# Patient Record
Sex: Female | Born: 1945 | Race: White | Hispanic: No | Smoking: Never smoker
Health system: Southern US, Community
[De-identification: ages and names within clinical notes are randomized; demographics above are authoritative.]

## PROBLEM LIST (undated history)

## (undated) DIAGNOSIS — G43909 Migraine, unspecified, not intractable, without status migrainosus: Secondary | ICD-10-CM

## (undated) DIAGNOSIS — F419 Anxiety disorder, unspecified: Secondary | ICD-10-CM

## (undated) DIAGNOSIS — I10 Essential (primary) hypertension: Secondary | ICD-10-CM

## (undated) HISTORY — PX: GASTRIC BYPASS: SHX52

## (undated) HISTORY — DX: Anxiety disorder, unspecified: F41.9

## (undated) HISTORY — DX: Migraine, unspecified, not intractable, without status migrainosus: G43.909

## (undated) HISTORY — PX: CHOLECYSTECTOMY: SHX55

## (undated) HISTORY — DX: Essential (primary) hypertension: I10

---

## 2011-04-09 DIAGNOSIS — I10 Essential (primary) hypertension: Secondary | ICD-10-CM | POA: Insufficient documentation

## 2011-04-09 DIAGNOSIS — G43009 Migraine without aura, not intractable, without status migrainosus: Secondary | ICD-10-CM | POA: Insufficient documentation

## 2011-04-09 DIAGNOSIS — E782 Mixed hyperlipidemia: Secondary | ICD-10-CM | POA: Insufficient documentation

## 2011-04-09 DIAGNOSIS — F3342 Major depressive disorder, recurrent, in full remission: Secondary | ICD-10-CM | POA: Insufficient documentation

## 2011-04-09 DIAGNOSIS — G4733 Obstructive sleep apnea (adult) (pediatric): Secondary | ICD-10-CM | POA: Insufficient documentation

## 2011-04-09 DIAGNOSIS — K912 Postsurgical malabsorption, not elsewhere classified: Secondary | ICD-10-CM | POA: Insufficient documentation

## 2014-05-12 DIAGNOSIS — Z961 Presence of intraocular lens: Secondary | ICD-10-CM | POA: Insufficient documentation

## 2014-05-26 DIAGNOSIS — Z961 Presence of intraocular lens: Secondary | ICD-10-CM | POA: Insufficient documentation

## 2015-05-21 DIAGNOSIS — M8580 Other specified disorders of bone density and structure, unspecified site: Secondary | ICD-10-CM | POA: Insufficient documentation

## 2015-07-20 DIAGNOSIS — M1711 Unilateral primary osteoarthritis, right knee: Secondary | ICD-10-CM | POA: Insufficient documentation

## 2015-12-03 DIAGNOSIS — M1712 Unilateral primary osteoarthritis, left knee: Secondary | ICD-10-CM | POA: Insufficient documentation

## 2016-01-31 DIAGNOSIS — E538 Deficiency of other specified B group vitamins: Secondary | ICD-10-CM | POA: Insufficient documentation

## 2016-05-18 DIAGNOSIS — K289 Gastrojejunal ulcer, unspecified as acute or chronic, without hemorrhage or perforation: Secondary | ICD-10-CM | POA: Insufficient documentation

## 2017-01-03 DIAGNOSIS — M545 Low back pain, unspecified: Secondary | ICD-10-CM | POA: Insufficient documentation

## 2017-01-03 DIAGNOSIS — M533 Sacrococcygeal disorders, not elsewhere classified: Secondary | ICD-10-CM | POA: Insufficient documentation

## 2017-01-16 DIAGNOSIS — H903 Sensorineural hearing loss, bilateral: Secondary | ICD-10-CM | POA: Insufficient documentation

## 2017-02-26 DIAGNOSIS — Z9884 Bariatric surgery status: Secondary | ICD-10-CM | POA: Insufficient documentation

## 2017-06-05 DIAGNOSIS — Z86010 Personal history of colonic polyps: Secondary | ICD-10-CM | POA: Insufficient documentation

## 2018-02-27 DIAGNOSIS — M5416 Radiculopathy, lumbar region: Secondary | ICD-10-CM | POA: Insufficient documentation

## 2018-02-27 DIAGNOSIS — J4520 Mild intermittent asthma, uncomplicated: Secondary | ICD-10-CM | POA: Insufficient documentation

## 2018-02-27 DIAGNOSIS — E1142 Type 2 diabetes mellitus with diabetic polyneuropathy: Secondary | ICD-10-CM | POA: Insufficient documentation

## 2018-04-29 DIAGNOSIS — M48061 Spinal stenosis, lumbar region without neurogenic claudication: Secondary | ICD-10-CM | POA: Insufficient documentation

## 2018-12-03 DIAGNOSIS — M546 Pain in thoracic spine: Secondary | ICD-10-CM | POA: Insufficient documentation

## 2020-07-28 DIAGNOSIS — M542 Cervicalgia: Secondary | ICD-10-CM | POA: Insufficient documentation

## 2020-07-28 DIAGNOSIS — M546 Pain in thoracic spine: Secondary | ICD-10-CM | POA: Insufficient documentation

## 2020-08-24 DIAGNOSIS — G894 Chronic pain syndrome: Secondary | ICD-10-CM | POA: Insufficient documentation

## 2020-08-24 DIAGNOSIS — Z9181 History of falling: Secondary | ICD-10-CM | POA: Insufficient documentation

## 2020-12-10 DIAGNOSIS — R3129 Other microscopic hematuria: Secondary | ICD-10-CM | POA: Insufficient documentation

## 2021-12-07 ENCOUNTER — Emergency Department: Payer: Medicare Other

## 2021-12-07 ENCOUNTER — Other Ambulatory Visit: Payer: Self-pay

## 2021-12-07 ENCOUNTER — Emergency Department
Admission: EM | Admit: 2021-12-07 | Discharge: 2021-12-08 | Disposition: A | Discharge status: Home / Self Care | Payer: Medicare Other | Attending: Emergency Medicine | Admitting: Emergency Medicine

## 2021-12-07 ENCOUNTER — Encounter: Payer: Self-pay | Admitting: *Deleted

## 2021-12-07 DIAGNOSIS — S2241XA Multiple fractures of ribs, right side, initial encounter for closed fracture: Secondary | ICD-10-CM | POA: Insufficient documentation

## 2021-12-07 DIAGNOSIS — W01198A Fall on same level from slipping, tripping and stumbling with subsequent striking against other object, initial encounter: Secondary | ICD-10-CM | POA: Insufficient documentation

## 2021-12-07 DIAGNOSIS — S299XXA Unspecified injury of thorax, initial encounter: Secondary | ICD-10-CM | POA: Diagnosis present

## 2021-12-07 DIAGNOSIS — S0083XA Contusion of other part of head, initial encounter: Secondary | ICD-10-CM | POA: Insufficient documentation

## 2021-12-07 DIAGNOSIS — W19XXXA Unspecified fall, initial encounter: Secondary | ICD-10-CM

## 2021-12-07 MED ORDER — MELOXICAM 7.5 MG PO TABS: 7.5000 mg | ORAL_TABLET | Freq: Every day | ORAL | 0 refills | Status: AC

## 2021-12-07 MED ORDER — OXYCODONE-ACETAMINOPHEN 5-325 MG PO TABS: 1.0000 | ORAL_TABLET | Freq: Four times a day (QID) | ORAL | 0 refills | Status: DC | PRN

## 2021-12-07 MED ORDER — OXYCODONE-ACETAMINOPHEN 5-325 MG PO TABS
1.0000 | ORAL_TABLET | Freq: Once | ORAL | Status: AC
  Administered 2021-12-08: 1 via ORAL
  Filled 2021-12-07: qty 1

## 2021-12-07 MED ORDER — OXYCODONE-ACETAMINOPHEN 5-325 MG PO TABS
1.0000 | ORAL_TABLET | Freq: Once | ORAL | Status: AC
  Administered 2021-12-07: 1 via ORAL
  Filled 2021-12-07: qty 1

## 2021-12-07 NOTE — ED Triage Notes (Addendum)
Pt fell 1 week ago.  Pt saw her doctor today and has rightrib fx.  Pt sent to er for eval.  Pt has bruising to chin area.   No chest pain.  No neck pain.  Pt has right back pain   no h/a  no loc.   Pt alert  speech clear.

## 2021-12-07 NOTE — ED Provider Notes (Signed)
[facility] Provider Note  Patient Contact: 11:29 PM (approximate)   History   Rib Injury   HPI  Margaret Walker is a 76 y.o. female who presents the emergency department complaining of rib pain and facial injury.  Patient sustained a mechanical fall approximately a week ago.  She states that she had bent over to pick up something, lost her balance and fell.  She did hit her face but did not lose consciousness.  She states that she has had significant bruising along the right side of her face, chin and into her neck.  She was also complaining of rib pain after this fall.  She saw her primary care, had rib x-rays and it was determined that she has 2 nondisplaced fractures to the right ribs.  Patient is in no shortness of breath, no substernal pain.  She denies any GI issues or abdominal pain.  Primary care was concerned given the bruising of her face and recommended that she follow-up in the ED for CT scans.  She denies any headache, visual changes, facial pain, neck pain at this time.     Physical Exam   Triage Vital Signs: ED Triage Vitals  Enc Vitals Group     BP 12/07/21 1952 (!) 168/64     Pulse Rate 12/07/21 1952 91     Resp 12/07/21 1952 20     Temp 12/07/21 1952 98.6 F (37 C)     Temp Source 12/07/21 1952 Oral     SpO2 12/07/21 1952 95 %     Weight 12/07/21 1958 215 lb (97.5 kg)     Height 12/07/21 1958 5' (1.524 m)     Head Circumference --      Peak Flow --      Pain Score 12/07/21 1952 9     Pain Loc --      Pain Edu? --      Excl. in GC? --     Most recent vital signs: Vitals:   12/07/21 1952 12/07/21 2318  BP: (!) 168/64 (!) 158/72  Pulse: 91 67  Resp: 20 20  Temp: 98.6 F (37 C)   SpO2: 95% 94%     General: Alert and in no acute distress. Eyes:  PERRL. EOMI. Head: Ecchymosis noted to the right mandibular line extending into the anterior neck.  Nontender to palpation along the mandible.  She has full range of motion.  No loose  dentition with evaluation of her mouth.  There is no lacerations of the mucosal surface of her mouth.  Nontender to palpation over the osseous structures of the head and face at this time.  No palpable abnormality or crepitus.  No battle signs, raccoon eyes, serosanguineous fluid drainage from the ears or nares. ENT:      Ears:       Nose: No congestion/rhinnorhea.      Mouth/Throat: Mucous membranes are moist.  No loose dentition and no intraoral laceration. Neck: No stridor. No cervical spine tenderness to palpation.  Cardiovascular:  Good peripheral perfusion Respiratory: Normal respiratory effort without tachypnea or retractions. Lungs CTAB. Good air entry to the bases with no decreased or absent breath sounds. Musculoskeletal: Full range of motion to all extremities.  Palpation along the right ribs reveals tenderness.  No palpable abnormality.  No crepitus.  No subcutaneous emphysema.  Good underlying breath sounds bilaterally. Neurologic:  No gross focal neurologic deficits are appreciated.  Skin:   No rash noted Other:   ED  Results / Procedures / Treatments   Labs (all labs ordered are listed, but only abnormal results are displayed) Labs Reviewed - No data to display   EKG     RADIOLOGY  I personally viewed and evaluated these images as part of my medical decision making, as well as reviewing the written report by the radiologist.  ED Provider Interpretation: Chest x-ray revealed no acute displaced rib fractures.  No pneumothorax.  CT scan of the head, face, neck reveals no acute traumatic findings to include facial fracture, skull fracture, intracranial hemorrhage or skull fractures of the cervical spine.  DG Chest 2 View  Result Date: 12/07/2021 CLINICAL DATA:  Larey SeatFell 1 week ago, right-sided rib pain EXAM: CHEST - 2 VIEW COMPARISON:  None. FINDINGS: Frontal and lateral views of the chest demonstrate an unremarkable cardiac silhouette. No acute airspace disease, effusion, or  pneumothorax. Mild bilateral interstitial prominence likely reflects scarring. No acute displaced fractures. Diffuse thoracic spondylosis. IMPRESSION: 1. No acute intrathoracic process. 2. No displaced fracture. Electronically Signed   By: Sharlet SalinaMichael  Brown M.D.   On: 12/07/2021 20:37   CT HEAD WO CONTRAST (5MM)  Result Date: 12/07/2021 CLINICAL DATA:  Larey SeatFell 1 week ago, facial bruising EXAM: CT HEAD WITHOUT CONTRAST TECHNIQUE: Contiguous axial images were obtained from the base of the skull through the vertex without intravenous contrast. RADIATION DOSE REDUCTION: This exam was performed according to the departmental dose-optimization program which includes automated exposure control, adjustment of the mA and/or kV according to patient size and/or use of iterative reconstruction technique. COMPARISON:  None. FINDINGS: Brain: No acute infarct or hemorrhage. Lateral ventricles and midline structures are unremarkable. No acute extra-axial fluid collections. No mass effect. Vascular: No hyperdense vessel or unexpected calcification. Skull: Normal. Negative for fracture or focal lesion. Sinuses/Orbits: Mild mucoperiosteal thickening throughout the ethmoid and maxillary sinuses. Other: None. IMPRESSION: 1. No acute intracranial process. Electronically Signed   By: Sharlet SalinaMichael  Brown M.D.   On: 12/07/2021 22:36   CT Cervical Spine Wo Contrast  Result Date: 12/07/2021 CLINICAL DATA:  Larey SeatFell 1 week ago, facial bruising EXAM: CT CERVICAL SPINE WITHOUT CONTRAST TECHNIQUE: Multidetector CT imaging of the cervical spine was performed without intravenous contrast. Multiplanar CT image reconstructions were also generated. RADIATION DOSE REDUCTION: This exam was performed according to the departmental dose-optimization program which includes automated exposure control, adjustment of the mA and/or kV according to patient size and/or use of iterative reconstruction technique. COMPARISON:  None. FINDINGS: Alignment: There is reversal of  cervical lordosis centered at C3-4, likely due to multilevel degenerative changes. Otherwise alignment is grossly anatomic. Skull base and vertebrae: No acute fracture. No primary bone lesion or focal pathologic process. Soft tissues and spinal canal: No prevertebral fluid or swelling. No visible canal hematoma. Disc levels: There is diffuse multilevel spondylosis and facet hypertrophy from C3-4 through C7-T1. Symmetrical neural foraminal encroachment at these levels. Upper chest: Airway is patent. Scattered ground-glass densities at the lung apices could reflect hypoventilatory change. Other: Reconstructed images demonstrate no additional findings. IMPRESSION: 1. No acute cervical spine fracture. 2. Extensive multilevel spondylosis and facet hypertrophy. 3. Scattered ground-glass opacities within the bilateral upper lobes, which could reflect hypoventilatory change. Electronically Signed   By: Sharlet SalinaMichael  Brown M.D.   On: 12/07/2021 22:39   CT Maxillofacial Wo Contrast  Result Date: 12/07/2021 CLINICAL DATA:  Larey SeatFell 1 week ago, facial bruising EXAM: CT MAXILLOFACIAL WITHOUT CONTRAST TECHNIQUE: Multidetector CT imaging of the maxillofacial structures was performed. Multiplanar CT image reconstructions were also generated. RADIATION  DOSE REDUCTION: This exam was performed according to the departmental dose-optimization program which includes automated exposure control, adjustment of the mA and/or kV according to patient size and/or use of iterative reconstruction technique. COMPARISON:  None. FINDINGS: Osseous: No fracture or mandibular dislocation. No destructive process. Orbits: Negative. No traumatic or inflammatory finding. Sinuses: Mild mucoperiosteal thickening throughout the ethmoid and maxillary sinuses. No gas fluid levels. Soft tissues: Minimal subcutaneous fat stranding overlying the midline mandible. Limited intracranial: No significant or unexpected finding. IMPRESSION: 1. No acute facial bone fracture.  Minimal soft tissue edema overlying the anterior aspect of the mandible. 2. Mild mucoperiosteal thickening throughout the ethmoid and maxillary sinuses. Electronically Signed   By: Sharlet Salina M.D.   On: 12/07/2021 22:41    PROCEDURES:  Critical Care performed: No  Procedures   MEDICATIONS ORDERED IN ED: Medications  oxyCODONE-acetaminophen (PERCOCET/ROXICET) 5-325 MG per tablet 1 tablet (1 tablet Oral Given 12/07/21 2211)     IMPRESSION / MDM / ASSESSMENT AND PLAN / ED COURSE  I reviewed the triage vital signs and the nursing notes.                              Differential diagnosis includes, but is not limited to, facial fracture, mandibular fracture, intracranial hemorrhage, cervical spine fracture   Patient's diagnosis is consistent with fall, facial contusion, rib fractures.  Patient presented to the emergency department after sustaining a fall a week ago.  She was seen by her primary care, had rib series x-rays which revealed 2 nondisplaced fractures.  Patient had a chest x-ray here which reveals no evidence of pneumothorax and fractures are not clearly identified.  However patient is symptomatic and already has a previous diagnosis of rib fractures.  Review of patient's medical records reveals x-ray from [facility] which reveals fractures of the fifth and sixth ribs.  Patient was sent for CT scans of the head, face, neck as she had significant bruising along the face and mandible region.  Imaging reveals no acute traumatic findings of the head, face, neck.  At this time patient will have symptom control for her rib fractures.  I will give her a low-dose anti-inflammatory and limited prescription of pain medication.  Patient does take tramadol from her primary care.  I will only prescribe #8 of Percocet for this acute pain in her ribs.  Follow-up precautions with her primary care as well as return precautions to the emergency department are discussed with the patient and her  daughter who is present at this time.. Patient is given ED precautions to return to the ED for any worsening or new symptoms.        FINAL CLINICAL IMPRESSION(S) / ED DIAGNOSES   Final diagnoses:  Fall, initial encounter  Closed fracture of multiple ribs of right side, initial encounter  Contusion of face, initial encounter     Rx / DC Orders   ED Discharge Orders          Ordered    meloxicam (MOBIC) 7.5 MG tablet  Daily        12/07/21 2337    oxyCODONE-acetaminophen (PERCOCET/ROXICET) 5-325 MG tablet  Every 6 hours PRN        12/07/21 2337             Note:  This document was prepared using Dragon voice recognition software and may include unintentional dictation errors.   Racheal Patches, PA-C 12/07/21 2337  Shaune Pollack, MD 12/11/21 1109

## 2021-12-08 DIAGNOSIS — S2241XA Multiple fractures of ribs, right side, initial encounter for closed fracture: Secondary | ICD-10-CM | POA: Diagnosis not present

## 2022-02-12 ENCOUNTER — Ambulatory Visit
Admission: EM | Admit: 2022-02-12 | Discharge: 2022-02-12 | Disposition: A | Discharge status: Home / Self Care | Payer: Medicare Other | Attending: Emergency Medicine | Admitting: Emergency Medicine

## 2022-02-12 ENCOUNTER — Encounter: Payer: Self-pay | Admitting: Emergency Medicine

## 2022-02-12 ENCOUNTER — Ambulatory Visit (INDEPENDENT_AMBULATORY_CARE_PROVIDER_SITE_OTHER): Payer: Medicare Other

## 2022-02-12 DIAGNOSIS — I10 Essential (primary) hypertension: Secondary | ICD-10-CM

## 2022-02-12 DIAGNOSIS — S62521A Displaced fracture of distal phalanx of right thumb, initial encounter for closed fracture: Secondary | ICD-10-CM

## 2022-02-12 NOTE — ED Triage Notes (Signed)
Pt here after a fall yesterday and having right thumb pain. Thumb is bruised from finger tip to palm of hand.  ?

## 2022-02-12 NOTE — Discharge Instructions (Addendum)
Follow-up with an orthopedic hand specialist for your broken thumb.   ? ?Your blood pressure is elevated today at 170/78; recheck 159/80.  Please have this rechecked by your primary care provider in 2-4 weeks.     ? ?

## 2022-02-12 NOTE — ED Provider Notes (Signed)
?[facility] ? ? ? ?CSN: [id number] ?Arrival date & time: 02/12/22  1235 ? ? ?  ? ?History   ?Chief Complaint ?Chief Complaint  ?Patient presents with  ? Fall  ? Thumb Injury  ? ? ?HPI ?Margaret Walker is a 76 y.o. female.  Patient presents with pain and bruising of her right thumb due to a fall yesterday.  She caught her thumb on a car door and fell.  No head injury or loss of consciousness.  She denies numbness, weakness, paresthesias, open wounds, or other symptoms.  Her medical history includes hypertension, chronic pain syndrome, gastric bypass, asthma, morbid obesity, depression.  She has not taken any of her medications today. ? ?The history is provided by the patient and medical records.  ? ?History reviewed. No pertinent past medical history. ? ?Patient Active Problem List  ? Diagnosis Date Noted  ? Microscopic hematuria 12/10/2020  ? Chronic pain syndrome 08/24/2020  ? Risk for falls 08/24/2020  ? Neck pain 07/28/2020  ? Acute midline thoracic back pain 07/28/2020  ? Thoracic spine pain 12/03/2018  ? 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 Mild intermittent asthma without complication 02/27/2018  ? Right lumbar radiculopathy 02/27/2018  ? Type 2 diabetes mellitus with diabetic polyneuropathy, without long-term current use of insulin (HCC) 02/27/2018  ? History of colon polyps 06/05/2017  ? 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 Sensorineural hearing loss (SNHL) of both ears 01/16/2017  ? Sacral back pain 01/03/2017  ? Low back pain radiating to both legs 01/03/2017  ? Marginal ulcer 05/18/2016  ? Vitamin B12 deficiency 01/31/2016  ? Primary osteoarthritis of left knee 12/03/2015  ? Obesity, morbid, BMI 40.0-49.9 (HCC) 09/22/2015  ? Primary osteoarthritis of right knee 07/20/2015  ? Osteopenia 05/21/2015  ? Pseudophakia of left eye 05/26/2014  ? Pseudophakia of right eye 05/12/2014  ? Essential hypertension 04/09/2011  ? Migraine without aura and without status  migrainosus, not intractable 04/09/2011  ? Mixed hyperlipidemia 04/09/2011  ? OSA on CPAP 04/09/2011  ? 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 Recurrent major depressive disorder, in full remission (HCC) 04/09/2011  ? ? ?History reviewed. No pertinent surgical history. ? ?OB History   ?No obstetric history on file. ?  ? ? ? ?Home Medications   ? ?Prior to Admission medications   ?Medication Sig Start Date End Date Taking? Authorizing Provider  ?metoprolol succinate (TOPROL-XL) 25 MG 24 hr tablet Take 1 tablet by mouth daily. 04/19/21  Yes [provider]  ?SUMAtriptan (IMITREX) 100 MG tablet Take by mouth. 05/19/21  Yes [provider]  ?amLODipine (NORVASC) 10 MG tablet Take 10 mg by mouth at bedtime. 01/11/22   [provider]  ?atorvastatin (LIPITOR) 20 MG tablet Take 20 mg by mouth daily. 01/11/22   [provider]  ?buPROPion (WELLBUTRIN XL) 300 MG 24 hr tablet Take 300 mg by mouth daily. 11/18/21   [provider]  ?fexofenadine (ALLEGRA) 180 MG tablet Take 180 mg by mouth daily. 12/09/21   [provider]  ?FLUoxetine (PROZAC) 20 MG capsule Take 60 mg by mouth every morning. 11/18/21   [provider]  ?meloxicam (MOBIC) 7.5 MG tablet Take 1 tablet (7.5 mg total) by mouth daily. 12/07/21 12/07/22  Cuthriell, Delorise RoyalsJonathan D, PA-C  ?oxyCODONE (OXY IR/ROXICODONE) 5 MG immediate release tablet Take 5 mg by mouth every 6 (six) hours as needed. 12/07/21   [provider]  ?oxyCODONE-acetaminophen (PERCOCET/ROXICET) 5-325 MG tablet Take 1 tablet by mouth  every 6 (six) hours as needed for severe pain. 12/07/21   Cuthriell, Delorise Royals, PA-C  ?terbinafine (LAMISIL) 250 MG tablet Take 250 mg by mouth daily. 12/07/21   [provider]  ? ? ?Family History ?History reviewed. No pertinent family history. ? ?Social History ?Social History  ? ?Tobacco Use  ? Smoking status: Never  ? Smokeless tobacco: Never  ?Substance Use Topics  ? Alcohol use: Not Currently   ? ? ? ?Allergies   ?Patient has no known allergies. ? ? ?Review of Systems ?Review of Systems  ?Constitutional:  Negative for chills and fever.  ?Musculoskeletal:  Positive for arthralgias and joint swelling.  ?Skin:  Positive for color change.  ?Neurological:  Negative for weakness and numbness.  ?All other systems reviewed and are negative. ? ? ?Physical Exam ?Triage Vital Signs ?ED Triage Vitals [02/12/22 1303]  ?Enc Vitals Group  ?   BP   ?   Pulse   ?   Resp   ?   Temp   ?   Temp src   ?   SpO2   ?   Weight   ?   Height   ?   Head Circumference   ?   Peak Flow   ?   Pain Score 4  ?   Pain Loc   ?   Pain Edu?   ?   Excl. in GC?   ? ?No data found. ? ?Updated Vital Signs ?BP (!) 159/80   Pulse 87   Temp 97.9 ?F (36.6 ?C)   Resp 18   SpO2 93%  ? ?Visual Acuity ?Right Eye Distance:   ?Left Eye Distance:   ?Bilateral Distance:   ? ?Right Eye Near:   ?Left Eye Near:    ?Bilateral Near:    ? ?Physical Exam ?Vitals and nursing note reviewed.  ?Constitutional:   ?   General: She is not in acute distress. ?   Appearance: She is well-developed. She is obese. She is not ill-appearing.  ?HENT:  ?   Mouth/Throat:  ?   Mouth: Mucous membranes are moist.  ?Cardiovascular:  ?   Rate and Rhythm: Normal rate and regular rhythm.  ?   Heart sounds: No murmur heard. ?Pulmonary:  ?   Effort: Pulmonary effort is normal. No respiratory distress.  ?   Breath sounds: Normal breath sounds.  ?Abdominal:  ?   Palpations: Abdomen is soft.  ?   Tenderness: There is no abdominal tenderness.  ?Musculoskeletal:     ?   General: Swelling and tenderness present.  ?   Cervical back: Neck supple.  ?   Comments: Edema and ecchymosis at tip of right thumb.  No open wounds.  ?Skin: ?   General: Skin is warm and dry.  ?   Capillary Refill: Capillary refill takes less than 2 seconds.  ?   Findings: Bruising present.  ?Neurological:  ?   Mental Status: She is alert.  ?   Sensory: No sensory deficit.  ?   Motor: No weakness.  ?Psychiatric:     ?    Mood and Affect: Mood normal.     ?   Behavior: Behavior normal.  ? ? ? ?UC Treatments / Results  ?Labs ?(all labs ordered are listed, but only abnormal results are displayed) ?Labs Reviewed - No data to display ? ?EKG ? ? ?Radiology ?DG Finger Thumb Right ? ?Result Date: 02/12/2022 ?CLINICAL DATA:  Fall with right thumb pain. EXAM: RIGHT THUMB 2+V  COMPARISON:  None Available. FINDINGS: There is a minimally displaced transverse fracture over the base of the first distal phalanx. Degenerative changes over the first carpometacarpal joint as well as the first MTP joint and interphalangeal joints. IMPRESSION: Minimally displaced transverse fracture over the base of the first distal phalanx. Electronically Signed   By: Elberta Fortis M.D.   On: 02/12/2022 13:06   ? ?Procedures ?Procedures (including critical care time) ? ?Medications Ordered in UC ?Medications - No data to display ? ?Initial Impression / Assessment and Plan / UC Course  ?I have reviewed the triage vital signs and the nursing notes. ? ?Pertinent labs & imaging results that were available during my care of the patient were reviewed by me and considered in my medical decision making (see chart for details). ? ?Close displaced fracture of right thumb.  Elevated blood pressure reading with hypertension.  X-ray shows a minimally displaced fracture of the right thumb. Discussed treatment options with patient and she prefers to go to [facility] for splinting.  She will go there now.  Also discussed with patient that her blood pressure is elevated today and needs to be rechecked by her PCP.  Instructed her to take her blood pressure meds and other medications when she gets home.  She has an appointment with her PCP on Tuesday.  She agrees to plan of care. ? ? ?Final Clinical Impressions(s) / UC Diagnoses  ? ?Final diagnoses:  ?Closed displaced fracture of distal phalanx of right thumb, initial encounter  ?Elevated blood pressure reading in office with diagnosis of  hypertension  ? ? ? ?Discharge Instructions   ? ?  ?Follow-up with an orthopedic hand specialist for your broken thumb.   ? ?Your blood pressure is elevated today at 170/78; recheck 159/80.  Please have this rech

## 2022-04-01 ENCOUNTER — Other Ambulatory Visit: Payer: Self-pay | Admitting: Internal Medicine

## 2022-04-01 DIAGNOSIS — R079 Chest pain, unspecified: Secondary | ICD-10-CM

## 2022-04-01 DIAGNOSIS — R002 Palpitations: Secondary | ICD-10-CM

## 2022-04-01 DIAGNOSIS — I509 Heart failure, unspecified: Secondary | ICD-10-CM

## 2022-04-07 ENCOUNTER — Encounter (HOSPITAL_COMMUNITY): Payer: Self-pay

## 2022-04-07 ENCOUNTER — Other Ambulatory Visit (HOSPITAL_COMMUNITY): Payer: Self-pay | Admitting: *Deleted

## 2022-04-07 MED ORDER — METOPROLOL TARTRATE 100 MG PO TABS: ORAL_TABLET | ORAL | 0 refills | Status: AC

## 2022-04-07 MED ORDER — IVABRADINE HCL 7.5 MG PO TABS: ORAL_TABLET | ORAL | 0 refills | Status: DC

## 2022-04-08 ENCOUNTER — Telehealth (HOSPITAL_COMMUNITY): Payer: Self-pay | Admitting: *Deleted

## 2022-04-08 NOTE — Telephone Encounter (Signed)
Reaching out to patient to offer assistance regarding upcoming cardiac imaging study; pt verbalizes understanding of appt date/time, parking situation and where to check in, pre-test NPO status and medications ordered; name and call back number provided for further questions should they arise  Larey Brick RN Navigator Cardiac Imaging [facility] [phone] office [phone] cell  Patient to take 100mg  metoprolol tartrate and 15mg  ivabradine two hours prior to her cardiac CT scan. Sent patient Mychart instructions. Informed her to call back if her pharmacy does not  have the ivabradine.

## 2022-04-11 ENCOUNTER — Ambulatory Visit
Admission: RE | Admit: 2022-04-11 | Discharge: 2022-04-11 | Disposition: A | Discharge status: Home / Self Care | Payer: Medicare Other | Source: Ambulatory Visit | Attending: Internal Medicine | Admitting: Internal Medicine

## 2022-04-11 DIAGNOSIS — I509 Heart failure, unspecified: Secondary | ICD-10-CM | POA: Insufficient documentation

## 2022-04-11 DIAGNOSIS — R079 Chest pain, unspecified: Secondary | ICD-10-CM | POA: Insufficient documentation

## 2022-04-11 DIAGNOSIS — R002 Palpitations: Secondary | ICD-10-CM | POA: Insufficient documentation

## 2022-04-11 MED ORDER — METOPROLOL TARTRATE 5 MG/5ML IV SOLN: 10.0000 mg | Freq: Once | INTRAVENOUS | Status: DC

## 2022-04-11 MED ORDER — NITROGLYCERIN 0.4 MG SL SUBL
0.8000 mg | SUBLINGUAL_TABLET | Freq: Once | SUBLINGUAL | Status: AC
  Administered 2022-04-11: 0.8 mg via SUBLINGUAL

## 2022-04-11 MED ORDER — IOHEXOL 350 MG/ML SOLN
125.0000 mL | Freq: Once | INTRAVENOUS | Status: AC | PRN
  Administered 2022-04-11: 125 mL via INTRAVENOUS

## 2022-04-11 NOTE — Progress Notes (Signed)
Patient tolerated procedure well. Ambulate w/o difficulty. Denies any lightheadedness or being dizzy. Pt denies any pain at this time. Sitting in chair, drinking water provided. Pt is encouraged to drink additional water throughout the day and reason explained to patient. Patient verbalized understanding and all questions answered. ABC intact. No further needs at this time. Discharge from procedure area w/o issues.  

## 2022-05-09 ENCOUNTER — Ambulatory Visit
Admission: EM | Admit: 2022-05-09 | Discharge: 2022-05-09 | Disposition: A | Discharge status: Home / Self Care | Payer: Medicare Other

## 2022-05-09 DIAGNOSIS — J069 Acute upper respiratory infection, unspecified: Secondary | ICD-10-CM

## 2022-05-09 MED ORDER — BENZONATATE 100 MG PO CAPS: 100.0000 mg | ORAL_CAPSULE | Freq: Three times a day (TID) | ORAL | 0 refills | Status: AC

## 2022-05-09 MED ORDER — PREDNISONE 20 MG PO TABS: 40.0000 mg | ORAL_TABLET | Freq: Every day | ORAL | 0 refills | Status: DC

## 2022-05-09 MED ORDER — ALBUTEROL SULFATE HFA 108 (90 BASE) MCG/ACT IN AERS: 2.0000 | INHALATION_SPRAY | RESPIRATORY_TRACT | 0 refills | Status: AC | PRN

## 2022-05-09 MED ORDER — AMOXICILLIN-POT CLAVULANATE 875-125 MG PO TABS: 1.0000 | ORAL_TABLET | Freq: Two times a day (BID) | ORAL | 0 refills | Status: AC

## 2022-05-09 NOTE — Discharge Instructions (Signed)
Based on your exam today, able to hear wheezing and congestion within the lungs which causes concern for possible pneumonia, we will avoid x-ray imaging and we will move forward with treatment  Take Augmentin every morning and every evening for the next 10 days, ideally you will start to see improvement in about 48 hours and steady progression from there  Starting tomorrow take prednisone every morning with food for the next 5 days this will reduce any inflammation and irritation to the airways and allow you to breathe easier  You may use Tessalon pill every 8 hours as needed to help manage your coughing, you may continue any of the over-the-counter medications you have been using in addition  You may take 2 puffs of albuterol inhaler every 4-6 hours as needed for shortness of breath and wheezing  Please schedule an appointment with your primary doctor for reevaluation in 10 days if symptoms have not improved or resolved

## 2022-05-09 NOTE — ED Triage Notes (Signed)
Pt presents with cough, fatigue x 5 days.  No HA, fever. Took Tessalon perles which helped but she ran out of them. Nyquil helped a little.

## 2022-05-09 NOTE — ED Provider Notes (Signed)
[name]    CSN: [id number] Arrival date & time: 05/09/22  1613      History   Chief Complaint Chief Complaint  Patient presents with   Cough    HPI Margaret Walker is a 76 y.o. female.   Patient presents with a productive cough, shortness of breath occurring only after coughing, wheezing, nasal congestion, rhinorrhea for 5 days.  No known sick contacts.  Decreased appetite but tolerating food and liquids.  Has attempted use of NyQuil and Tessalon which have been somewhat helpful.  Denies fever, chills, body aches, ear pain, sore throat, abdominal pain, nausea, vomiting or diarrhea.  History of migraines, anxiety, hypertension, obesity, diabetes.  Past Medical History:  Diagnosis Date   Anxiety    Hypertension    Migraines     Patient Active Problem List   Diagnosis Date Noted   Microscopic hematuria 12/10/2020   Chronic pain syndrome 08/24/2020   Risk for falls 08/24/2020   Neck pain 07/28/2020   Acute midline thoracic back pain 07/28/2020   Thoracic spine pain 12/03/2018   Spinal stenosis of lumbar region with radiculopathy 04/29/2018   Mild intermittent asthma without complication 02/27/2018   Right lumbar radiculopathy 02/27/2018   Type 2 diabetes mellitus with diabetic polyneuropathy, without long-term current use of insulin (HCC) 02/27/2018   History of colon polyps 06/05/2017   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   Sensorineural hearing loss (SNHL) of both ears 01/16/2017   Sacral back pain 01/03/2017   Low back pain radiating to both legs 01/03/2017   Marginal ulcer 05/18/2016   Vitamin B12 deficiency 01/31/2016   Primary osteoarthritis of left knee 12/03/2015   Obesity, morbid, BMI 40.0-49.9 (HCC) 09/22/2015   Primary osteoarthritis of right knee 07/20/2015   Osteopenia 05/21/2015   Pseudophakia of left eye 05/26/2014   Pseudophakia of right eye 05/12/2014   Essential hypertension 04/09/2011   Migraine without aura and without  status migrainosus, not intractable 04/09/2011   Mixed hyperlipidemia 04/09/2011   OSA on CPAP 04/09/2011   Postsurgical malabsorption 04/09/2011   Recurrent major depressive disorder, in full remission (HCC) 04/09/2011    Past Surgical History:  Procedure Laterality Date   CHOLECYSTECTOMY     GASTRIC BYPASS      OB History   No obstetric history on file.      Home Medications    Prior to Admission medications   Medication Sig Start Date End Date Taking? Authorizing Provider  albuterol (VENTOLIN HFA) 108 (90 Base) MCG/ACT inhaler Inhale 2 puffs into the lungs every 4 (four) hours as needed for wheezing or shortness of breath. 05/09/22  Yes Ayline Dingus R, NP  amLODipine (NORVASC) 10 MG tablet Take 10 mg by mouth at bedtime. 01/11/22  Yes [provider]  amoxicillin-clavulanate (AUGMENTIN) 875-125 MG tablet Take 1 tablet by mouth every 12 (twelve) hours for 10 days. 05/09/22 05/19/22 Yes Dajion Bickford R, NP  atorvastatin (LIPITOR) 20 MG tablet Take 20 mg by mouth daily. 01/11/22  Yes [provider]  benzonatate (TESSALON) 100 MG capsule Take 1 capsule (100 mg total) by mouth every 8 (eight) hours. 05/09/22  Yes Jearl Soto R, NP  buPROPion (WELLBUTRIN XL) 300 MG 24 hr tablet Take 300 mg by mouth daily. 11/18/21  Yes [provider]  Dulaglutide (TRULICITY) 1.5 MG/0.5ML SOPN Inject into the skin.   Yes [provider]  fexofenadine (ALLEGRA) 180 MG tablet Take 180 mg by mouth daily. 12/09/21  Yes [provider]  FLUoxetine (  PROZAC) 20 MG capsule Take 60 mg by mouth every morning. 11/18/21  Yes [provider]  metoprolol succinate (TOPROL-XL) 25 MG 24 hr tablet Take 1 tablet by mouth daily. 04/19/21  Yes [provider]  predniSONE (DELTASONE) 20 MG tablet Take 2 tablets (40 mg total) by mouth daily. 05/09/22  Yes Aviannah Castoro R, NP  SUMAtriptan (IMITREX) 100 MG tablet Take by mouth. 05/19/21  Yes [provider]   traMADol (ULTRAM) 50 MG tablet Take by mouth every 6 (six) hours as needed.   Yes [provider]  ivabradine (CORLANOR) 7.5 MG TABS tablet Take tablets (15mg ) TWO hours prior to your cardiac CT scan. 04/07/22   04/09/22, MD  meloxicam (MOBIC) 7.5 MG tablet Take 1 tablet (7.5 mg total) by mouth daily. 12/07/21 12/07/22  Cuthriell, 12/09/22, PA-C  metoprolol tartrate (LOPRESSOR) 100 MG tablet Take tablet (100mg ) TWO hours prior to your cardiac CT scan. Do not take your daily metoprolol succinate. 04/07/22   , MD  oxyCODONE (OXY IR/ROXICODONE) 5 MG immediate release tablet Take 5 mg by mouth every 6 (six) hours as needed. 12/07/21   [provider]  oxyCODONE-acetaminophen (PERCOCET/ROXICET) 5-325 MG tablet Take 1 tablet by mouth every 6 (six) hours as needed for severe pain. 12/07/21   Cuthriell, 12/09/21, PA-C  terbinafine (LAMISIL) 250 MG tablet Take 250 mg by mouth daily. 12/07/21   [provider]    Family History History reviewed. No pertinent family history.  Social History Social History   Tobacco Use   Smoking status: Never   Smokeless tobacco: Never  Vaping Use   Vaping Use: Never used  Substance Use Topics   Alcohol use: Not Currently   Drug use: Never     Allergies   Patient has no known allergies.   Review of Systems Review of Systems  Constitutional: Negative.   HENT:  Positive for congestion and rhinorrhea. Negative for dental problem, drooling, ear discharge, ear pain, facial swelling, hearing loss, mouth sores, nosebleeds, postnasal drip, sinus pressure, sinus pain, sneezing, sore throat, tinnitus, trouble swallowing and voice change.   Respiratory:  Positive for cough, shortness of breath and wheezing. Negative for apnea, choking, chest tightness and stridor.   Cardiovascular: Negative.   Gastrointestinal: Negative.      Physical Exam Triage Vital Signs ED Triage Vitals [05/09/22 1643]  Enc Vitals Group      BP 126/81     Pulse Rate 91     Resp (!) 21     Temp 98.9 F (37.2 C)     Temp Source Oral     SpO2 92 %     Weight      Height      Head Circumference      Peak Flow      Pain Score 0     Pain Loc      Pain Edu?      Excl. in GC?    No data found.  Updated Vital Signs BP 126/81 (BP Location: Left Arm)   Pulse 91   Temp 98.9 F (37.2 C) (Oral)   Resp (!) 21   SpO2 92%   Visual Acuity Right Eye Distance:   Left Eye Distance:   Bilateral Distance:    Right Eye Near:   Left Eye Near:    Bilateral Near:     Physical Exam Constitutional:      Appearance: Normal appearance.  HENT:     Head: Normocephalic.  Right Ear: Tympanic membrane, ear canal and external ear normal.     Left Ear: Tympanic membrane, ear canal and external ear normal.     Nose: Congestion and rhinorrhea present.     Mouth/Throat:     Mouth: Mucous membranes are moist.     Pharynx: Oropharynx is clear.  Eyes:     Extraocular Movements: Extraocular movements intact.  Cardiovascular:     Rate and Rhythm: Normal rate and regular rhythm.     Pulses: Normal pulses.     Heart sounds: Normal heart sounds.  Pulmonary:     Effort: Pulmonary effort is normal.     Breath sounds: Rhonchi present.     Comments: Harsh congested cough witnessed during exam Skin:    General: Skin is warm and dry.  Neurological:     Mental Status: She is alert and oriented to person, place, and time. Mental status is at baseline.  Psychiatric:        Mood and Affect: Mood normal.        Behavior: Behavior normal.      UC Treatments / Results  Labs (all labs ordered are listed, but only abnormal results are displayed) Labs Reviewed - No data to display  EKG   Radiology No results found.  Procedures Procedures (including critical care time)  Medications Ordered in UC Medications - No data to display  Initial Impression / Assessment and Plan / UC Course  I have reviewed the triage vital signs and  the nursing notes.  Pertinent labs & imaging results that were available during my care of the patient were reviewed by me and considered in my medical decision making (see chart for details).   Acute upper respiratory infection  Vital signs are stable, O2 saturation 92% on room air, breathing is unlabored, rhonchi and wheezing heard to auscultation, concern for pneumonia, will defer imaging at this time and move forward with treatment, Augmentin 10-day course prescribed as well as prednisone, Tessalon and albuterol for additional supportive measures, may continue use of over-the-counter medications as needed, recommended follow-up with PCP in 10 days after completion of antibiotics, may follow-up with this urgent care as needed Final Clinical Impressions(s) / UC Diagnoses   Final diagnoses:  Acute upper respiratory infection     Discharge Instructions      Based on your exam today, able to hear wheezing and congestion within the lungs which causes concern for possible pneumonia, we will avoid x-ray imaging and we will move forward with treatment  Take Augmentin every morning and every evening for the next 10 days, ideally you will start to see improvement in about 48 hours and steady progression from there  Starting tomorrow take prednisone every morning with food for the next 5 days this will reduce any inflammation and irritation to the airways and allow you to breathe easier  You may use Tessalon pill every 8 hours as needed to help manage your coughing, you may continue any of the over-the-counter medications you have been using in addition  You may take 2 puffs of albuterol inhaler every 4-6 hours as needed for shortness of breath and wheezing  Please schedule an appointment with your primary doctor for reevaluation in 10 days if symptoms have not improved or resolved   ED Prescriptions     Medication Sig Dispense Auth. Provider   amoxicillin-clavulanate (AUGMENTIN) 875-125 MG  tablet Take 1 tablet by mouth every 12 (twelve) hours for 10 days. 20 tablet Valinda Hoar, NP  predniSONE (DELTASONE) 20 MG tablet Take 2 tablets (40 mg total) by mouth daily. 10 tablet Mace Weinberg R, NP   benzonatate (TESSALON) 100 MG capsule Take 1 capsule (100 mg total) by mouth every 8 (eight) hours. 42 capsule Anjel Pardo R, NP   albuterol (VENTOLIN HFA) 108 (90 Base) MCG/ACT inhaler Inhale 2 puffs into the lungs every 4 (four) hours as needed for wheezing or shortness of breath. 18 g Valinda Hoar, NP      PDMP not reviewed this encounter.   Valinda Hoar, NP 05/09/22 1714

## 2022-11-27 IMAGING — CT CT CERVICAL SPINE W/O CM
2 of 4 series · 9 of 33 positions shown, 11 images · non-contrast
Comparison: None.

CLINICAL DATA: Fell 1 week ago, facial bruising



[Series 5: coronal bone · coronal · 0.23mm/px · 3 of 37 slices shown]
[im 8/37  bone]
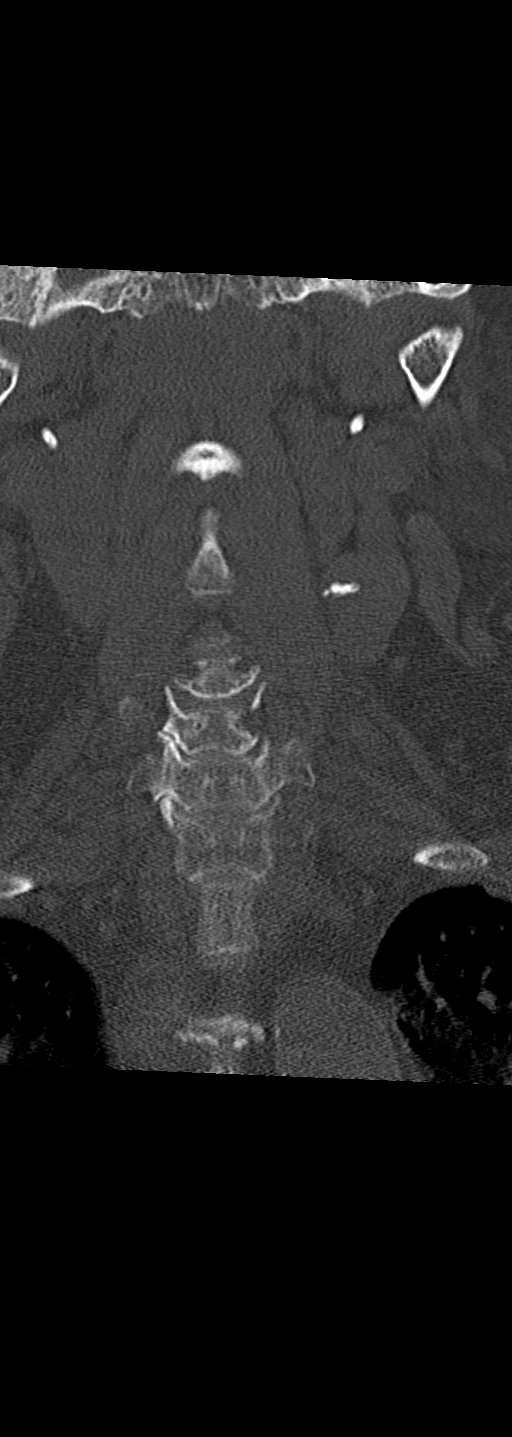
[im 15/37  bone]
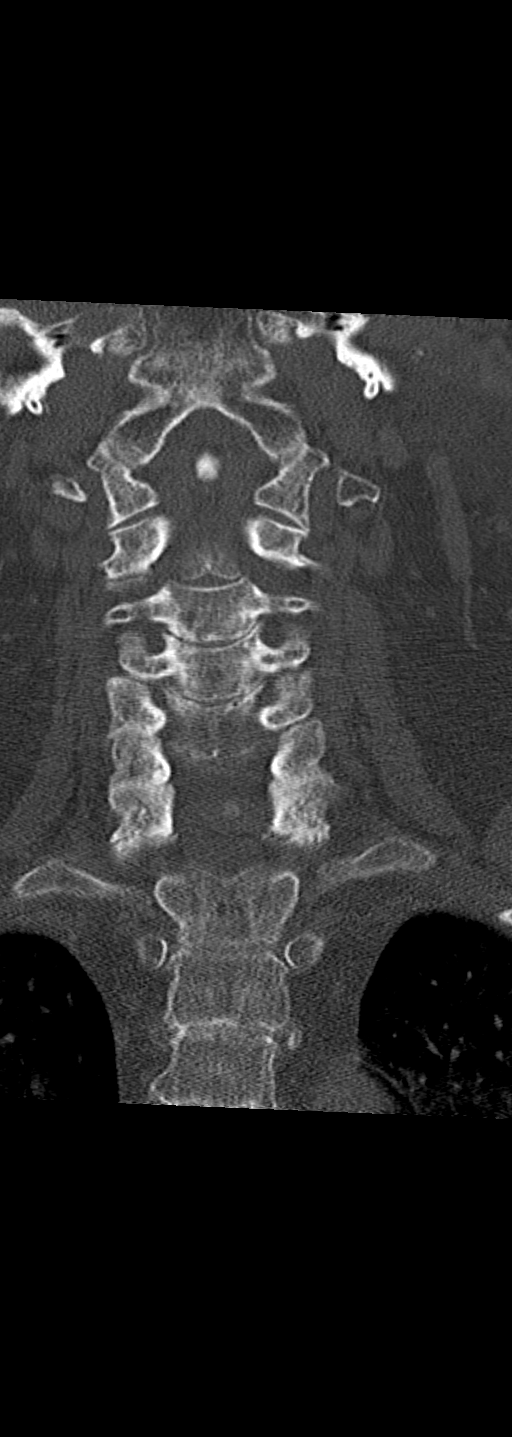
[im 22/37  bone]
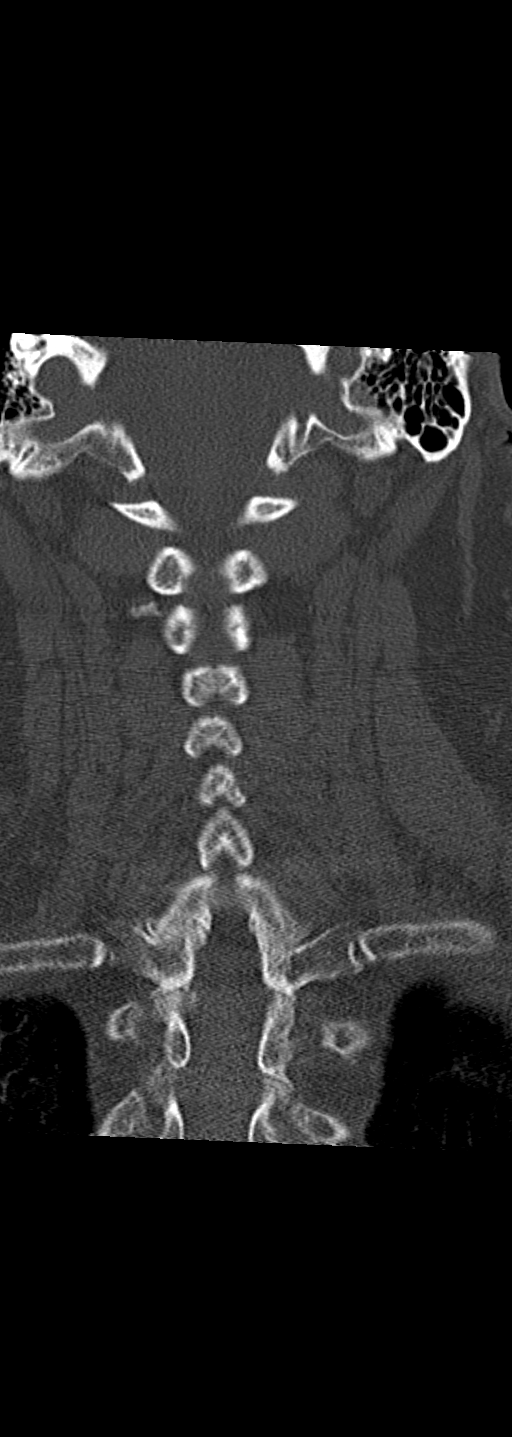

[Series 6: orthogonal axials · axial · 0.18mm/px · z∈[-257,-118]mm · 6 of 120 slices shown, 8 images]
[im 18/120  soft-tissue]
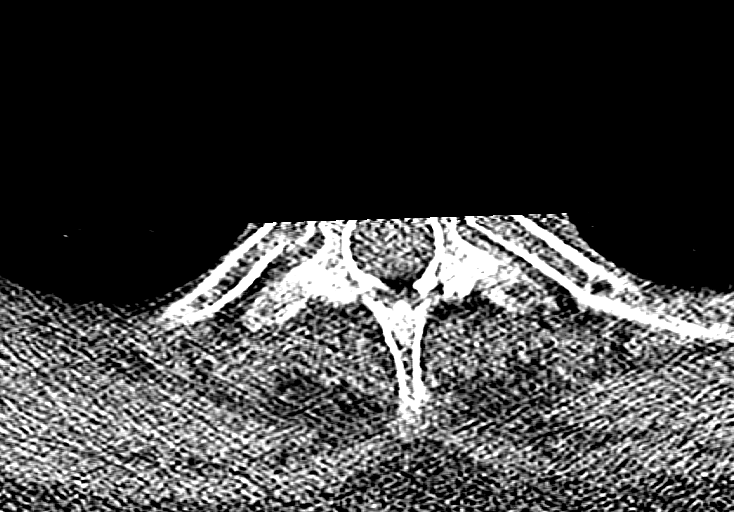
[im 18/120  bone]
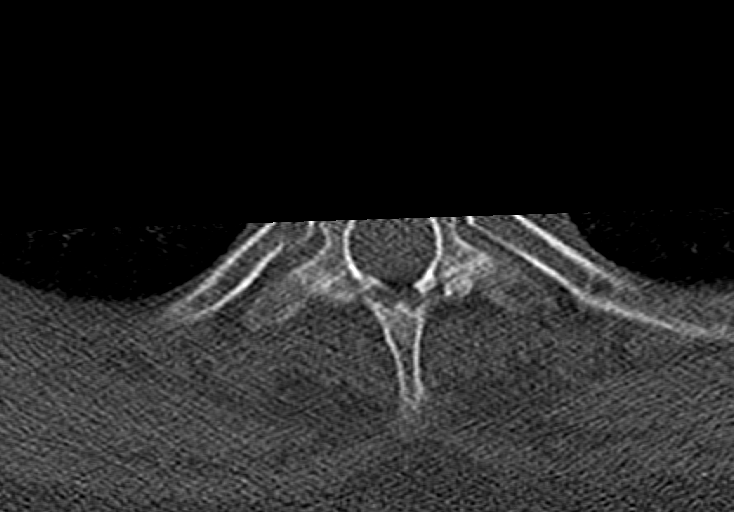
[im 35/120  bone]
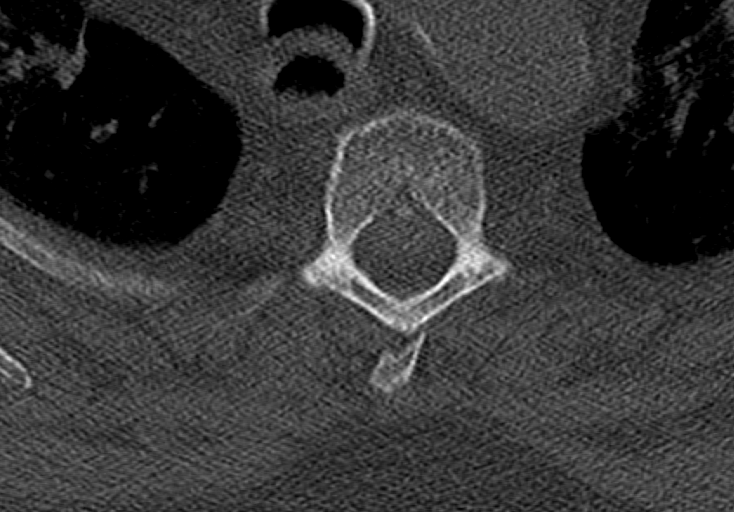
[im 52/120  bone]
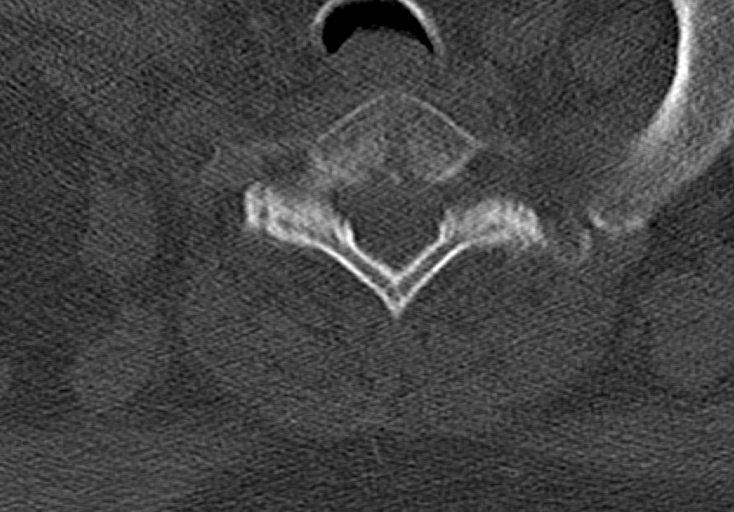
[im 69/120  bone]
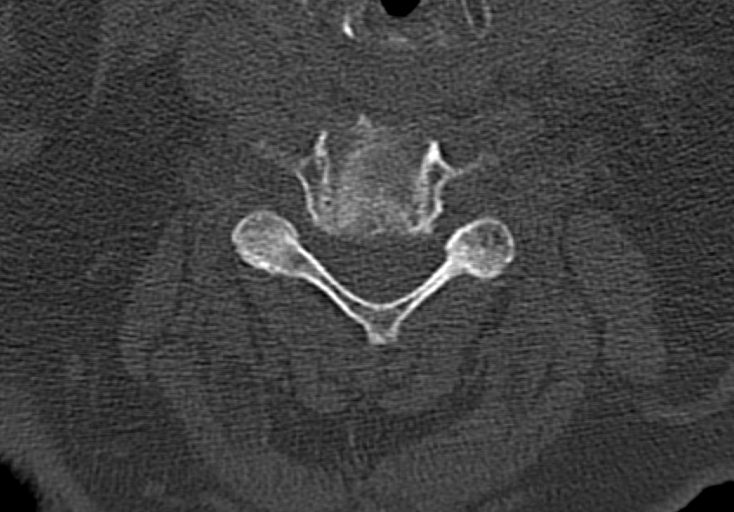
[im 86/120  soft-tissue]
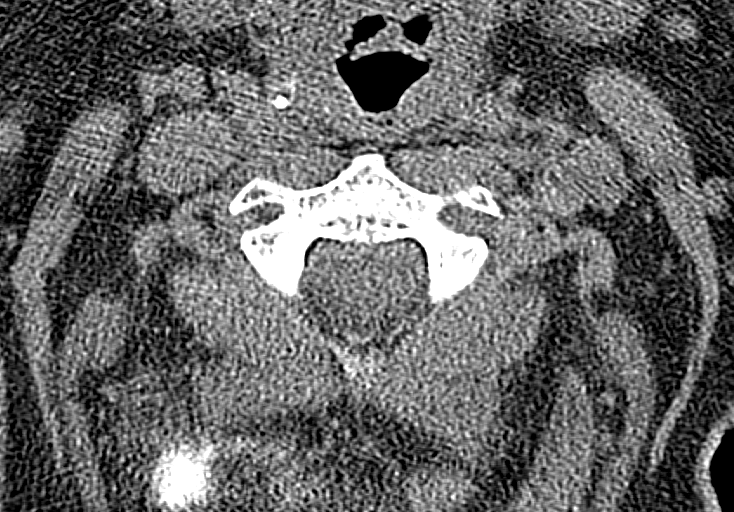
[im 86/120  bone]
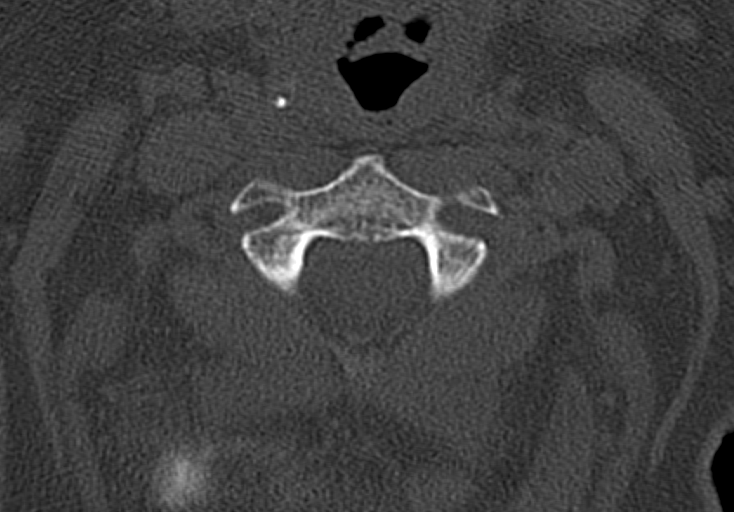
[im 103/120  bone]
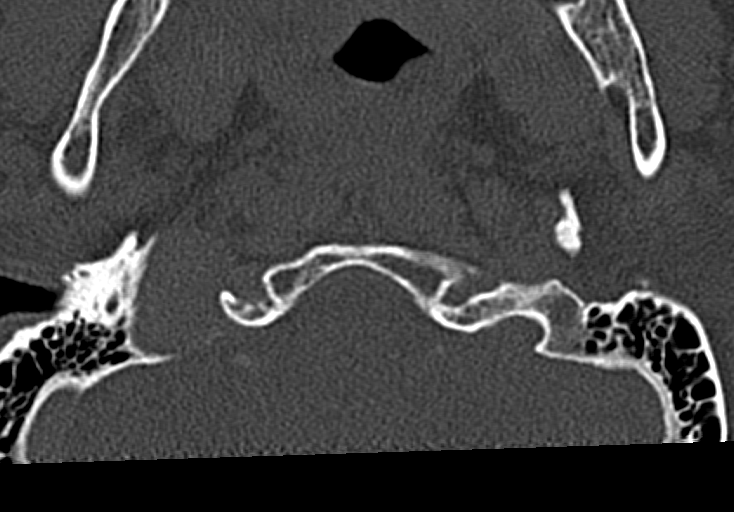

[9 of 33 positions shown; findings below may reference images not displayed]

FINDINGS: Alignment: There is reversal of cervical lordosis centered at C3-4,
likely due to multilevel degenerative changes. Otherwise alignment
is grossly anatomic.

Skull base and vertebrae: No acute fracture. No primary bone lesion
or focal pathologic process.

Soft tissues and spinal canal: No prevertebral fluid or swelling. No
visible canal hematoma.

Disc levels: There is diffuse multilevel spondylosis and facet
hypertrophy from C3-4 through C7-T1. Symmetrical neural foraminal
encroachment at these levels.

Upper chest: Airway is patent. Scattered ground-glass densities at
the lung apices could reflect hypoventilatory change.

Other: Reconstructed images demonstrate no additional findings.
IMPRESSION: 1. No acute cervical spine fracture.
2. Extensive multilevel spondylosis and facet hypertrophy.
3. Scattered ground-glass opacities within the bilateral upper
lobes, which could reflect hypoventilatory change.

## 2023-02-02 IMAGING — DX DG FINGER THUMB 2+V*R*
3 series · 3 of 3 positions shown · non-contrast
Comparison: None Available.

CLINICAL DATA: Fall with right thumb pain.

EXAM:
RIGHT THUMB 2+V

[thumb ap]
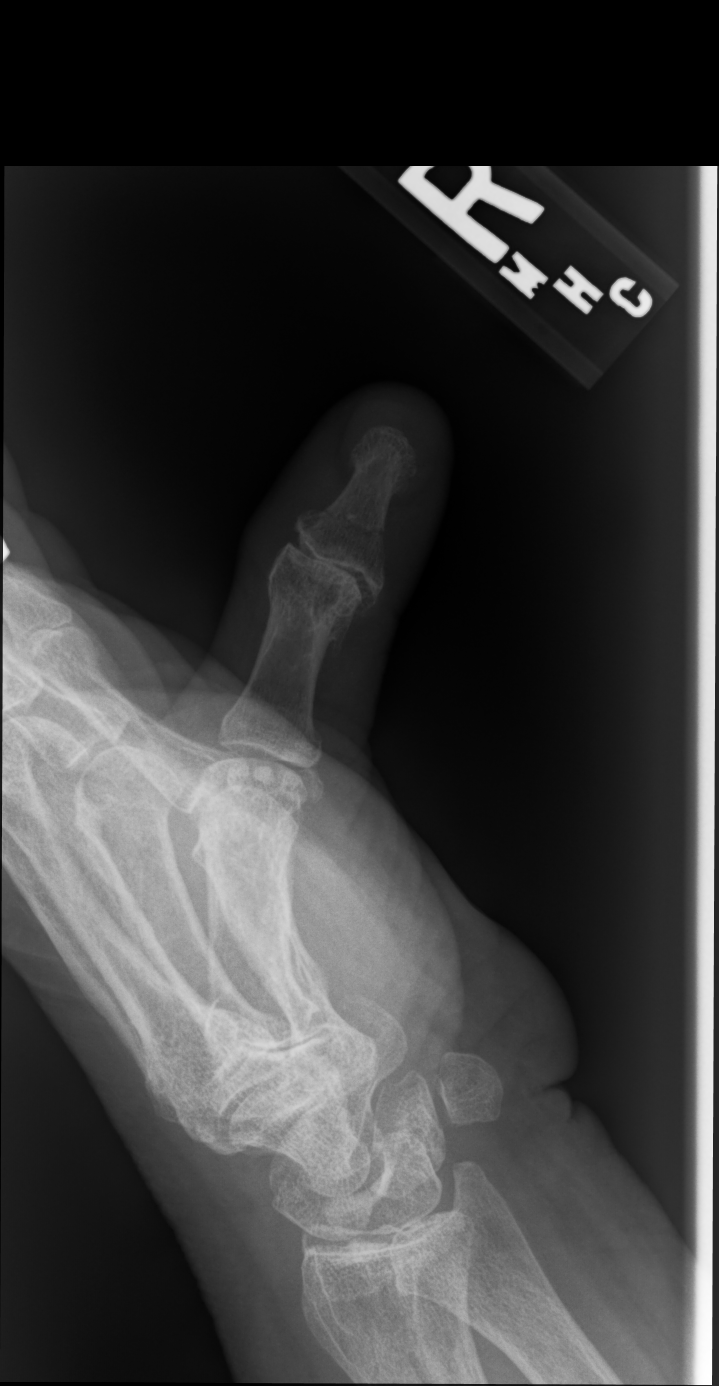

[thumb mlo]
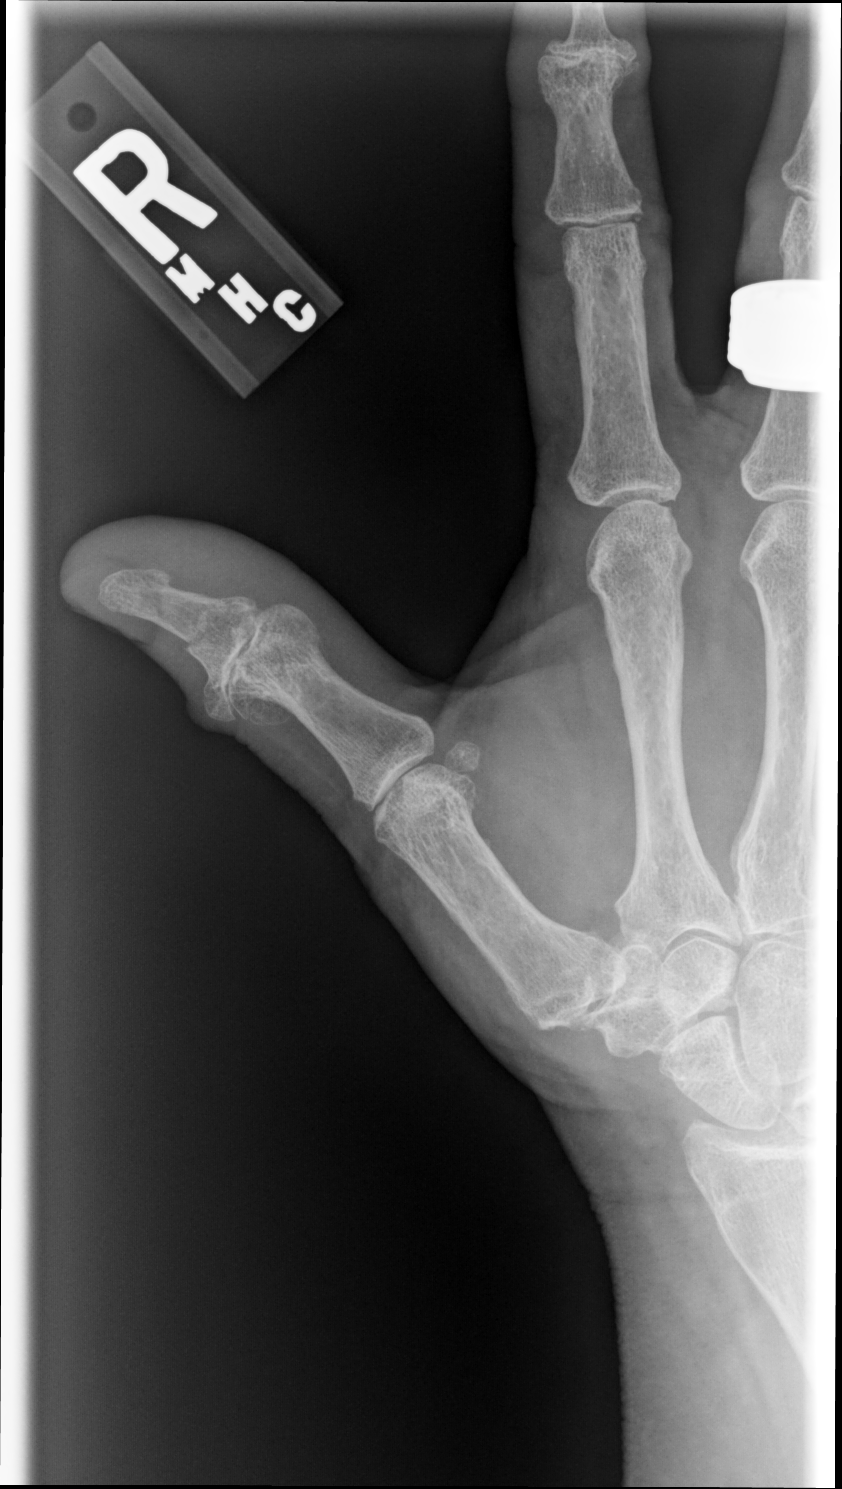

[thumb lat]
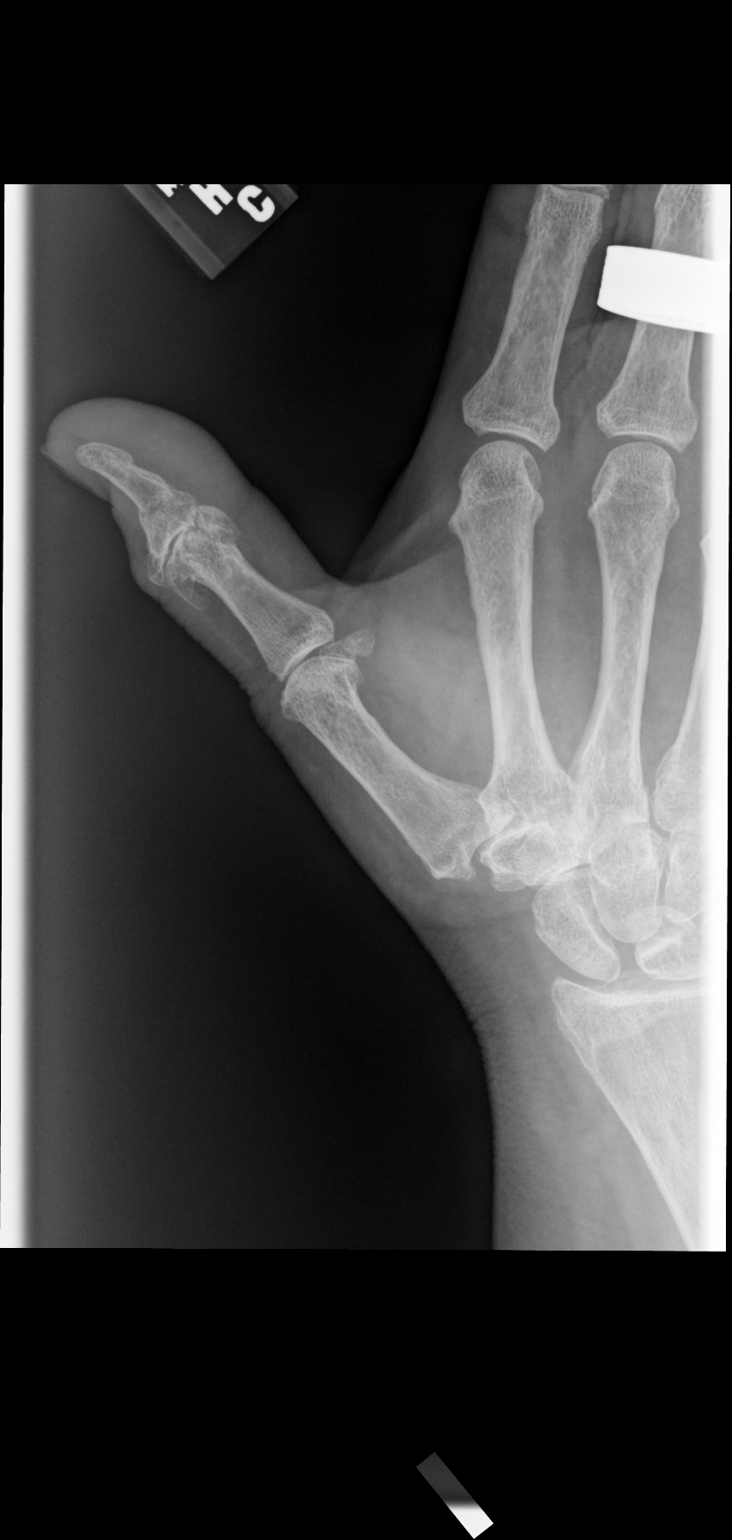

[3 of 3 positions shown; findings below may reference images not displayed]

FINDINGS: There is a minimally displaced transverse fracture over the base of
the first distal phalanx. Degenerative changes over the first
carpometacarpal joint as well as the first MTP joint and
interphalangeal joints.
IMPRESSION: Minimally displaced transverse fracture over the base of the first
distal phalanx.

## 2023-06-02 ENCOUNTER — Ambulatory Visit
Admission: EM | Admit: 2023-06-02 | Discharge: 2023-06-02 | Disposition: A | Discharge status: Home / Self Care | Payer: Medicare Other

## 2023-06-02 DIAGNOSIS — U071 COVID-19: Secondary | ICD-10-CM

## 2023-06-02 MED ORDER — NIRMATRELVIR/RITONAVIR (PAXLOVID)TABLET: 3.0000 | ORAL_TABLET | Freq: Two times a day (BID) | ORAL | 0 refills | Status: AC

## 2023-06-02 NOTE — ED Provider Notes (Signed)
Margaret Walker    CSN: [id number] Arrival date & time: 06/02/23  4098      History   Chief Complaint Chief Complaint  Patient presents with   Cough   Covid Positive    HPI Margaret Walker is a 77 y.o. female.  Patient presents with 1 day history of chills, body aches, sore throat, cough.  She tested positive for COVID this morning at home. She denies fever, shortness of breath, chest pain, vomiting, diarrhea, or other symptoms.  Treating symptoms with Tylenol.  Her medical history includes asthma, hypertension, diabetes, migraine headaches, gastric bypass, chronic pain syndrome.  The history is provided by the patient and medical records.    Past Medical History:  Diagnosis Date   Anxiety    Hypertension    Migraines     Patient Active Problem List   Diagnosis Date Noted   Microscopic hematuria 12/10/2020   Chronic pain syndrome 08/24/2020   Risk for falls 08/24/2020   Neck pain 07/28/2020   Acute midline thoracic back pain 07/28/2020   Thoracic spine pain 12/03/2018   Spinal stenosis of lumbar region with radiculopathy 04/29/2018   Mild intermittent asthma without complication 02/27/2018   Right lumbar radiculopathy 02/27/2018   Type 2 diabetes mellitus with diabetic polyneuropathy, without long-term current use of insulin (HCC) 02/27/2018   History of colon polyps 06/05/2017   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   Sensorineural hearing loss (SNHL) of both ears 01/16/2017   Sacral back pain 01/03/2017   Low back pain radiating to both legs 01/03/2017   Marginal ulcer 05/18/2016   Vitamin B12 deficiency 01/31/2016   Primary osteoarthritis of left knee 12/03/2015   Obesity, morbid, BMI 40.0-49.9 (HCC) 09/22/2015   Primary osteoarthritis of right knee 07/20/2015   Osteopenia 05/21/2015   Pseudophakia of left eye 05/26/2014   Pseudophakia of right eye 05/12/2014   Essential hypertension 04/09/2011   Migraine without aura and without status  migrainosus, not intractable 04/09/2011   Mixed hyperlipidemia 04/09/2011   OSA on CPAP 04/09/2011   Postsurgical malabsorption 04/09/2011   Recurrent major depressive disorder, in full remission (HCC) 04/09/2011    Past Surgical History:  Procedure Laterality Date   CHOLECYSTECTOMY     GASTRIC BYPASS      OB History   No obstetric history on file.      Home Medications    Prior to Admission medications   Medication Sig Start Date End Date Taking? Authorizing Provider  albuterol (VENTOLIN HFA) 108 (90 Base) MCG/ACT inhaler Inhale 2 puffs into the lungs every 4 (four) hours as needed for wheezing or shortness of breath. 05/09/22  Yes White, Adrienne R, NP  amLODipine (NORVASC) 10 MG tablet Take 10 mg by mouth at bedtime. 01/11/22  Yes [provider]  atorvastatin (LIPITOR) 20 MG tablet Take 20 mg by mouth daily. 01/11/22  Yes [provider]  benzonatate (TESSALON) 100 MG capsule Take 1 capsule (100 mg total) by mouth every 8 (eight) hours. 05/09/22  Yes White, Adrienne R, NP  buPROPion (WELLBUTRIN XL) 300 MG 24 hr tablet Take 300 mg by mouth daily. 11/18/21  Yes [provider]  fexofenadine (ALLEGRA) 180 MG tablet Take 180 mg by mouth daily. 12/09/21  Yes [provider]  FLUoxetine (PROZAC) 20 MG capsule Take 60 mg by mouth every morning. 11/18/21  Yes [provider]  metoprolol succinate (TOPROL-XL) 25 MG 24 hr tablet Take 1 tablet by mouth daily. 04/19/21  Yes [provider]  metoprolol tartrate (LOPRESSOR) 100 MG tablet Take tablet (100mg ) TWO hours prior to your cardiac CT scan. Do not take your daily metoprolol succinate. 04/07/22  Yes Agbor-Etang, Arlys John, MD  MOUNJARO 7.5 MG/0.5ML Pen Inject 7.5 mg into the skin once a week. 04/20/23  Yes [provider]  nirmatrelvir/ritonavir (PAXLOVID) 20 x 150 MG & 10 x 100MG  TABS Take 3 tablets by mouth 2 (two) times daily for 5 days. Take nirmatrelvir (150 mg) two tablets twice daily for  5 days and ritonavir (100 mg) one tablet twice daily for 5 days. 06/02/23 06/07/23 Yes Mickie Bail, NP  SUMAtriptan (IMITREX) 100 MG tablet Take by mouth. 05/19/21  Yes [provider]  traMADol (ULTRAM) 50 MG tablet Take by mouth every 6 (six) hours as needed.   Yes [provider]    Family History History reviewed. No pertinent family history.  Social History Social History   Tobacco Use   Smoking status: Never   Smokeless tobacco: Never  Vaping Use   Vaping status: Never Used  Substance Use Topics   Alcohol use: Not Currently   Drug use: Never     Allergies   Itraconazole and Nsaids   Review of Systems Review of Systems  Constitutional:  Positive for chills. Negative for fever.  HENT:  Positive for congestion and sore throat. Negative for ear pain.   Respiratory:  Positive for cough. Negative for shortness of breath.   Cardiovascular:  Negative for chest pain and palpitations.  Gastrointestinal:  Negative for diarrhea and vomiting.  All other systems reviewed and are negative.    Physical Exam Triage Vital Signs ED Triage Vitals  Encounter Vitals Group     BP      Systolic BP Percentile      Diastolic BP Percentile      Pulse      Resp      Temp      Temp src      SpO2      Weight      Height      Head Circumference      Peak Flow      Pain Score      Pain Loc      Pain Education      Exclude from Growth Chart    No data found.  Updated Vital Signs BP 127/76 (BP Location: Left Arm)   Pulse 86   Temp 98.8 F (37.1 C)   Resp 18   SpO2 95%   Visual Acuity Right Eye Distance:   Left Eye Distance:   Bilateral Distance:    Right Eye Near:   Left Eye Near:    Bilateral Near:     Physical Exam Vitals and nursing note reviewed.  Constitutional:      General: She is not in acute distress.    Appearance: She is well-developed. She is obese.  HENT:     Right Ear: Tympanic membrane normal.     Left Ear: Tympanic membrane  normal.     Nose: Rhinorrhea present.     Mouth/Throat:     Mouth: Mucous membranes are moist.     Pharynx: Oropharynx is clear.  Cardiovascular:     Rate and Rhythm: Normal rate and regular rhythm.     Heart sounds: Normal heart sounds.  Pulmonary:     Effort: Pulmonary effort is normal. No respiratory distress.     Breath sounds: Normal breath sounds.  Musculoskeletal:     Cervical  back: Neck supple.  Skin:    General: Skin is warm and dry.  Neurological:     Mental Status: She is alert.      UC Treatments / Results  Labs (all labs ordered are listed, but only abnormal results are displayed) Labs Reviewed - No data to display  EKG   Radiology No results found.  Procedures Procedures (including critical care time)  Medications Ordered in UC Medications - No data to display  Initial Impression / Assessment and Plan / UC Course  I have reviewed the triage vital signs and the nursing notes.  Pertinent labs & imaging results that were available during my care of the patient were reviewed by me and considered in my medical decision making (see chart for details).   COVID-19, positive COVID test at home.  Treating with Paxlovid.  GFR 66 on 03/22/2023.  Discussed that this medication is emergency authorized for treatment of COVID.  Discussed the side effects of Paxlovid, including dysgeusia, diarrhea, myalgias, hypertension.  Also discussed the possibility of rebound COVID.  Instructed her to pause her statin while on Paxlovid.  Instructed patient to notify her PCP that she is COVID positive and taking Paxlovid.  ED precautions given.  Patient agrees to plan of care.   Final Clinical Impressions(s) / UC Diagnoses   Final diagnoses:  Positive self-administered antigen test for COVID-19  COVID-19     Discharge Instructions      Take the Paxlovid as directed.  Take Tylenol as needed for fever or discomfort.  Rest and keep yourself hydrated.    Pause your cholesterol  medication atorvastatin while taking Paxlovid.   Go to the emergency department if you have shortness of breath or other concerning symptoms.    Call your primary care provider to let them know that you are COVID positive and taking Paxlovid.         ED Prescriptions     Medication Sig Dispense Auth. Provider   nirmatrelvir/ritonavir (PAXLOVID) 20 x 150 MG & 10 x 100MG  TABS Take 3 tablets by mouth 2 (two) times daily for 5 days. Take nirmatrelvir (150 mg) two tablets twice daily for 5 days and ritonavir (100 mg) one tablet twice daily for 5 days. 30 tablet Mickie Bail, NP      PDMP not reviewed this encounter.   Mickie Bail, NP 06/02/23 1013

## 2023-06-02 NOTE — Discharge Instructions (Addendum)
Take the Paxlovid as directed.  Take Tylenol as needed for fever or discomfort.  Rest and keep yourself hydrated.    Pause your cholesterol medication atorvastatin while taking Paxlovid.   Go to the emergency department if you have shortness of breath or other concerning symptoms.    Call your primary care provider to let them know that you are COVID positive and taking Paxlovid.

## 2023-06-02 NOTE — ED Triage Notes (Signed)
Cough, congestion, body aches, headache, sore throat x1 day. Tested positive with home covid test today. Taking tylenol.

## 2023-07-05 ENCOUNTER — Ambulatory Visit
Admission: RE | Admit: 2023-07-05 | Discharge: 2023-07-05 | Disposition: A | Discharge status: Home / Self Care | Payer: Medicare Other | Source: Ambulatory Visit | Attending: Family Medicine | Admitting: Family Medicine

## 2023-07-05 ENCOUNTER — Other Ambulatory Visit: Payer: Self-pay | Admitting: Family Medicine

## 2023-07-05 DIAGNOSIS — R0602 Shortness of breath: Secondary | ICD-10-CM

## 2023-07-05 DIAGNOSIS — E1142 Type 2 diabetes mellitus with diabetic polyneuropathy: Secondary | ICD-10-CM

## 2023-07-05 MED ORDER — IOHEXOL 350 MG/ML SOLN
75.0000 mL | Freq: Once | INTRAVENOUS | Status: AC | PRN
  Administered 2023-07-05: 75 mL via INTRAVENOUS
